# Patient Record
Sex: Male | Born: 1985 | Hispanic: Yes | Marital: Single | Smoking: Never smoker
Health system: Southern US, Community
[De-identification: ages and names within clinical notes are randomized; demographics above are authoritative.]

---

## 2005-11-12 ENCOUNTER — Ambulatory Visit: Payer: Self-pay | Admitting: Family Medicine

## 2016-03-08 ENCOUNTER — Emergency Department (HOSPITAL_COMMUNITY): Payer: Self-pay

## 2016-03-08 ENCOUNTER — Encounter (HOSPITAL_COMMUNITY): Payer: Self-pay | Admitting: Emergency Medicine

## 2016-03-08 ENCOUNTER — Emergency Department (HOSPITAL_COMMUNITY)
Admission: EM | Admit: 2016-03-08 | Discharge: 2016-03-08 | Disposition: A | Discharge status: Home / Self Care | Payer: Self-pay | Attending: Emergency Medicine | Admitting: Emergency Medicine

## 2016-03-08 DIAGNOSIS — K6289 Other specified diseases of anus and rectum: Secondary | ICD-10-CM | POA: Insufficient documentation

## 2016-03-08 LAB — URINALYSIS, ROUTINE W REFLEX MICROSCOPIC
Bilirubin Urine: NEGATIVE
Glucose, UA: NEGATIVE mg/dL
HGB URINE DIPSTICK: NEGATIVE
Ketones, ur: NEGATIVE mg/dL
Leukocytes, UA: NEGATIVE
Nitrite: NEGATIVE
PROTEIN: NEGATIVE mg/dL
Specific Gravity, Urine: 1.024 (ref 1.005–1.030)
pH: 5.5 (ref 5.0–8.0)

## 2016-03-08 LAB — I-STAT CHEM 8, ED
BUN: 15 mg/dL (ref 6–20)
CHLORIDE: 102 mmol/L (ref 101–111)
Calcium, Ion: 1.18 mmol/L (ref 1.15–1.40)
Creatinine, Ser: 0.9 mg/dL (ref 0.61–1.24)
Glucose, Bld: 100 mg/dL — ABNORMAL HIGH (ref 65–99)
HCT: 47 % (ref 39.0–52.0)
HEMOGLOBIN: 16 g/dL (ref 13.0–17.0)
Potassium: 3.7 mmol/L (ref 3.5–5.1)
SODIUM: 140 mmol/L (ref 135–145)
TCO2: 25 mmol/L (ref 0–100)

## 2016-03-08 MED ORDER — IOPAMIDOL (ISOVUE-300) INJECTION 61%
INTRAVENOUS | Status: AC
  Administered 2016-03-08: 100 mL
  Filled 2016-03-08: qty 100

## 2016-03-08 MED ORDER — METRONIDAZOLE 500 MG PO TABS: 500.0000 mg | ORAL_TABLET | Freq: Two times a day (BID) | ORAL | 0 refills | Status: AC

## 2016-03-08 MED ORDER — FENTANYL CITRATE (PF) 100 MCG/2ML IJ SOLN
50.0000 ug | Freq: Once | INTRAMUSCULAR | Status: AC
  Administered 2016-03-08: 50 ug via INTRAVENOUS
  Filled 2016-03-08: qty 2

## 2016-03-08 MED ORDER — CIPROFLOXACIN HCL 500 MG PO TABS: 500.0000 mg | ORAL_TABLET | Freq: Two times a day (BID) | ORAL | 0 refills | Status: AC

## 2016-03-08 NOTE — ED Notes (Signed)
Pt comfortable with discharge and follow up instructions. Pt declines wheelchair, escorted to waiting area by this RN. Rx x2 

## 2016-03-08 NOTE — Discharge Instructions (Signed)
Take the antibiotics as prescribed. Follow up with the GI doctor. Return to the ED if you develop new or worsening symptoms.

## 2016-03-08 NOTE — ED Triage Notes (Signed)
Pt reports three months of hemorrhoid with no relief from the medications he has tried. Pt here for pain relief from the hemorrhoid. Pt also thinks he has a UTI and reports frequent painful urination.

## 2016-03-08 NOTE — ED Provider Notes (Signed)
[facility] DEPT Provider Note   CSN: [id number] Arrival date & time: 03/08/16  1558  By signing my name below, I, Sandrea HammondStephen Dignan, attest that this documentation has been prepared under the direction and in the presence of Glynn OctaveStephen Reegan Mctighe, MD. Electronically Signed: Sandrea HammondStephen Dignan, ED Scribe. 03/08/16. 4:27 PM.   History   Chief Complaint Chief Complaint  Patient presents with  . Hemorrhoids  . Urinary Tract Infection    HPI Comments: Joel Nolan is a 30 y.o. male who presents to the Emergency Department complaining of painful bowel movements after being diagnosed with  hemorrhoids one month ago despite the use of his prescribed cream. Pt says he sees bright red blood when he wipes his anus after a bowel movement with intermittent bright red blood in stool. Pt says he sometimes strains during BM. He denies hard stools, black stools, or unusual change in stool character. Pt says he has back pain and pain on each BM. He denies stomach pain.    The history is provided by the patient. No language interpreter was used.    History reviewed. No pertinent past medical history.  There are no active problems to display for this patient.   No past surgical history on file.     Home Medications    Prior to Admission medications   Not on File    Family History No family history on file.  Social History Social History  Substance Use Topics  . Smoking status: Not on file  . Smokeless tobacco: Not on file  . Alcohol use Not on file     Allergies   Review of patient's allergies indicates no known allergies.   Review of Systems Review of Systems  Constitutional: Negative for fever.  Gastrointestinal: Positive for blood in stool and rectal pain.  Musculoskeletal: Positive for back pain.  All other systems reviewed and are negative.    Physical Exam Updated Vital Signs BP 133/83 (BP Location: Right Arm)   Pulse 78   Temp 98.1 F (36.7 C) (Oral)   SpO2 99%     Physical Exam  Constitutional: He is oriented to person, place, and time. He appears well-developed and well-nourished. No distress.  HENT:  Head: Normocephalic and atraumatic.  Mouth/Throat: Oropharynx is clear and moist. No oropharyngeal exudate.  Eyes: Conjunctivae and EOM are normal. Pupils are equal, round, and reactive to light.  Neck: Normal range of motion. Neck supple.  No meningismus.  Cardiovascular: Normal rate, regular rhythm, normal heart sounds and intact distal pulses.   No murmur heard. Pulmonary/Chest: Effort normal and breath sounds normal. No respiratory distress.  Abdominal: Soft. There is no tenderness. There is no rebound and no guarding.  Abdomen soft, nontender  Genitourinary:  Genitourinary Comments: Small abrasion at the 1 o'clock position around his anus No external hemorrhoids No fissures Induration on digital rectal exam from the 12 o'clock position to the 6 o'clock position  Musculoskeletal: Normal range of motion. He exhibits no edema or tenderness.  Neurological: He is alert and oriented to person, place, and time. No cranial nerve deficit. He exhibits normal muscle tone. Coordination normal.  No ataxia on finger to nose bilaterally. No pronator drift. 5/5 strength throughout. CN 2-12 intact.Equal grip strength. Sensation intact.   Skin: Skin is warm.  Psychiatric: He has a normal mood and affect. His behavior is normal.  Nursing note and vitals reviewed.    ED Treatments / Results   DIAGNOSTIC STUDIES: Oxygen Saturation is 99% on RA, normal by  my interpretation.    COORDINATION OF CARE: 4:20 PM Discussed treatment plan with pt at bedside and pt agreed to plan.   Labs (all labs ordered are listed, but only abnormal results are displayed) Labs Reviewed  I-STAT CHEM 8, ED - Abnormal; Notable for the following:       Result Value   Glucose, Bld 100 (*)    All other components within normal limits  URINALYSIS, ROUTINE W REFLEX MICROSCOPIC  (NOT AT [facility])    EKG  EKG Interpretation None       Radiology Ct Pelvis W Contrast  Result Date: 03/08/2016 CLINICAL DATA:  Rectal induration on the right. Pain in that region for the past 3 weeks. Hemorrhoids. Clinical concern for abscess. EXAM: CT PELVIS WITH CONTRAST TECHNIQUE: Multidetector CT imaging of the pelvis was performed using the standard protocol following the bolus administration of intravenous contrast. CONTRAST:  ISOVUE-300 IOPAMIDOL (ISOVUE-300) INJECTION 61% COMPARISON:  None. FINDINGS: Urinary Tract:  No abnormality visualized. Bowel: Mild sigmoid and descending colon diverticulosis without evidence of diverticulitis. Mild inferior rectal wall thickening. Normal appearing appendix and small bowel. Vascular/Lymphatic: No pathologically enlarged lymph nodes. No significant vascular abnormality seen. Reproductive:  Normal sized prostate gland. Other: Small left inguinal hernia containing fat. Mildly prominent vessel extending posteriorly from the rectum on the right, within the subcutaneous fat. No soft tissue stranding or fluid collection. Musculoskeletal: Normal appearing bones. IMPRESSION: 1. Probable small varicosity in the subcutaneous fat extending posteriorly from the rectum on the right. 2. Mild inferior rectal wall thickening. This could be due to proctitis. A neoplastic process is less likely. 3. No abscess seen. 4. Sigmoid and distal descending colon diverticulosis. Electronically Signed   By: Beckie Salts M.D.   On: 03/08/2016 17:20    Procedures Procedures (including critical care time)  Medications Ordered in ED Medications - No data to display   Initial Impression / Assessment and Plan / ED Course  I have reviewed the triage vital signs and the nursing notes.  Pertinent labs & imaging results that were available during my care of the patient were reviewed by me and considered in my medical decision making (see chart for details).  Clinical Course  1  month of rectal pain, bleeding with BM, told had hemorrhoids but none seen on exam.   Induration on exam with concern for possible perirectal abscess.  CT scan obtained which shows no abscess but does show proctitis and inflamed varicosity.  Patient with no history of inflammatory bowel disease. Start Flagyl and cipro. Refer to gastroenterology. Return precautions discussed.   Final Clinical Impressions(s) / ED Diagnoses   Final diagnoses:  Proctitis    New Prescriptions New Prescriptions   No medications on file   I personally performed the services described in this documentation, which was scribed in my presence. The recorded information has been reviewed and is accurate.      Glynn Octave, MD 03/08/16 1754

## 2016-03-30 ENCOUNTER — Encounter (HOSPITAL_COMMUNITY): Payer: Self-pay | Admitting: Emergency Medicine

## 2016-03-30 ENCOUNTER — Emergency Department (HOSPITAL_COMMUNITY)
Admission: EM | Admit: 2016-03-30 | Discharge: 2016-03-30 | Disposition: A | Discharge status: Home / Self Care | Payer: Self-pay | Attending: Emergency Medicine | Admitting: Emergency Medicine

## 2016-03-30 DIAGNOSIS — K6289 Other specified diseases of anus and rectum: Secondary | ICD-10-CM

## 2016-03-30 LAB — GC/CHLAMYDIA PROBE AMP (~~LOC~~) NOT AT ARMC
Chlamydia: NEGATIVE
Neisseria Gonorrhea: NEGATIVE

## 2016-03-30 MED ORDER — SULFAMETHOXAZOLE-TRIMETHOPRIM 800-160 MG PO TABS: 1.0000 | ORAL_TABLET | Freq: Two times a day (BID) | ORAL | 0 refills | Status: AC

## 2016-03-30 MED ORDER — TRAMADOL HCL 50 MG PO TABS: 50.0000 mg | ORAL_TABLET | Freq: Four times a day (QID) | ORAL | 0 refills | Status: AC | PRN

## 2016-03-30 MED ORDER — HYDROCODONE-ACETAMINOPHEN 5-325 MG PO TABS
1.0000 | ORAL_TABLET | Freq: Once | ORAL | Status: AC
  Administered 2016-03-30: 1 via ORAL
  Filled 2016-03-30 (×2): qty 1

## 2016-03-30 MED ORDER — LIDOCAINE (ANORECTAL) 5 % EX GEL: 1.0000 "application " | CUTANEOUS | 2 refills | Status: AC | PRN

## 2016-03-30 NOTE — ED Triage Notes (Signed)
Patient states he was seen here for Hemorid states its been going on for 2 months. Patient states he was given referral for GI but appt is 2 months away and can not tolerate pain. Patient states pain when he has BM.

## 2016-03-30 NOTE — ED Provider Notes (Signed)
[facility] Provider Note   CSN: [id number] Arrival date & time: 03/30/16  0800     History   Chief Complaint No chief complaint on file.   HPI Joel Nolan is a 30 y.o. male with a past medical history of hemorrhoids, proctitis who presents to the ED today complaining of ongoing rectal pain. Patient states that he was [place] He was given cream and stool softeners which did not provide him any relief. He was [facility] 2 weeks ago and had a CT scan of his pelvis performed. He was told that time that he no longer had hemorrhoids. He was told to follow-up with GI but states his appointment is not for another 3 months he has not been able to see them. He currently reports continued severe rectal pain and swelling. Patient also has pain with bowel movements despite stool softener use. He sees occasional bright red blood on the toilet paper with wiping. He denies having anal sex. He denies abdominal pain, melena, diarrhea, vomiting, dysuria.  HPI  History reviewed. No pertinent past medical history.  There are no active problems to display for this patient.   History reviewed. No pertinent surgical history.     Home Medications    Prior to Admission medications   Medication Sig Start Date End Date Taking? Authorizing Provider  ciprofloxacin (CIPRO) 500 MG tablet Take 1 tablet (500 mg total) by mouth 2 (two) times daily. 03/08/16   Glynn OctaveStephen Rancour, MD  Lidocaine, Anorectal, 5 % GEL Apply 1 application topically as needed. 03/30/16   Dartanyan Deasis Tripp Zakir Henner, PA-C  metroNIDAZOLE (FLAGYL) 500 MG tablet Take 1 tablet (500 mg total) by mouth 2 (two) times daily. 03/08/16   Glynn OctaveStephen Rancour, MD  sulfamethoxazole-trimethoprim (BACTRIM DS,SEPTRA DS) 800-160 MG tablet Take 1 tablet by mouth 2 (two) times daily. 03/30/16 04/06/16  Julizza Sassone Tripp Ishitha Roper, PA-C  traMADol (ULTRAM) 50 MG tablet Take 1 tablet (50  mg total) by mouth every 6 (six) hours as needed. 03/30/16   Angelino Rumery Tripp Kitt Ledet, PA-C    Family History No family history on file.  Social History Social History  Substance Use Topics  . Smoking status: Never Smoker  . Smokeless tobacco: Never Used  . Alcohol use Yes     Allergies   Review of patient's allergies indicates no known allergies.   Review of Systems Review of Systems  All other systems reviewed and are negative.    Physical Exam Updated Vital Signs BP 112/94   Pulse 74   Temp 98.1 F (36.7 C) (Oral)   Resp 15   SpO2 97%   Physical Exam  Constitutional: He is oriented to person, place, and time. He appears well-developed and well-nourished. No distress.  HENT:  Head: Normocephalic and atraumatic.  Eyes: Conjunctivae are normal. Right eye exhibits no discharge. Left eye exhibits no discharge. No scleral icterus.  Cardiovascular: Normal rate.   Pulmonary/Chest: Effort normal.  Genitourinary:  Genitourinary Comments: No external hemorrhoid present. There is induration around rectum but no obvious fluctuance or abscess. TTP on DRE. No gross blood on rectal exam.  Neurological: He is alert and oriented to person, place, and time. Coordination normal.  Skin: Skin is warm and dry. No rash noted. He is not diaphoretic. No erythema. No pallor.  Psychiatric: He has a normal mood and affect. His behavior is normal.  Nursing note and vitals reviewed.    ED  Treatments / Results  Labs (all labs ordered are listed, but only abnormal results are displayed) Labs Reviewed  GC/CHLAMYDIA PROBE AMP ([facility]wa Gentry) NOT AT [facility]    EKG  EKG Interpretation None       Radiology No results found.  Procedures Procedures (including critical care time)  Medications Ordered in ED Medications  HYDROcodone-acetaminophen (NORCO/VICODIN) 5-325 MG per tablet 1 tablet (1 tablet Oral Given 03/30/16 7846)     Initial Impression / Assessment and Plan / ED Course  I  have reviewed the triage vital signs and the nursing notes.  Pertinent labs & imaging results that were available during my care of the patient were reviewed by me and considered in my medical decision making (see chart for details).  Clinical Course    Otherwise healthy 30 year old male presents the ED today complaining of ongoing rectal pain. Patient also reports pain with bowel movements and occasional bright red blood on the toilet paper with wiping. On presentation ED, patient overall appears well. Vitals are stable. Reviewed CT scan From 10/5 which showed signs of proctitis varicosity. Patient was initiated on Cipro and Flagyl which she states he took without relief of his symptoms. Will swab rectum today for GC/chlamydia as this is common causes of infectious proctitis. Patient still denies having anal sex. On exam, patient does have some mild induration around the rectum but no obvious sign of abscess and recent CT scan did not show any sign of abscess. Will not repeat today. However, will initiate additional antibiotic to cover for skin infection. We'll also provide pain medication, lidocaine jelly. GI referral again given. Encourage patient to continue stool softener use.  Patient was discussed with and seen by Dr. Adriana Simas who agrees with the treatment plan.    Final Clinical Impressions(s) / ED Diagnoses   Final diagnoses:  Proctitis    New Prescriptions New Prescriptions   LIDOCAINE, ANORECTAL, 5 % GEL    Apply 1 application topically as needed.   SULFAMETHOXAZOLE-TRIMETHOPRIM (BACTRIM DS,SEPTRA DS) 800-160 MG TABLET    Take 1 tablet by mouth 2 (two) times daily.   TRAMADOL (ULTRAM) 50 MG TABLET    Take 1 tablet (50 mg total) by mouth every 6 (six) hours as needed.     Lester Kinsman [place], PA-C 03/31/16 1607    Donnetta Hutching, MD 04/01/16 [phone]

## 2016-03-30 NOTE — Discharge Instructions (Signed)
Take antibiotics as prescribed. Use anusol suppositories. Continue taking stool softener. Use lidocaine jelly for pain. Follow up with GI as soon as possible for re-evaluation.

## 2016-04-03 ENCOUNTER — Encounter (HOSPITAL_COMMUNITY): Payer: Self-pay | Admitting: Emergency Medicine

## 2016-04-03 ENCOUNTER — Emergency Department (HOSPITAL_COMMUNITY)
Admission: EM | Admit: 2016-04-03 | Discharge: 2016-04-03 | Disposition: A | Discharge status: Home / Self Care | Payer: Self-pay | Attending: Emergency Medicine | Admitting: Emergency Medicine

## 2016-04-03 DIAGNOSIS — K6289 Other specified diseases of anus and rectum: Secondary | ICD-10-CM | POA: Insufficient documentation

## 2016-04-03 MED ORDER — DOCUSATE SODIUM 100 MG PO CAPS: 100.0000 mg | ORAL_CAPSULE | Freq: Two times a day (BID) | ORAL | 0 refills | Status: AC

## 2016-04-03 MED ORDER — OXYCODONE-ACETAMINOPHEN 5-325 MG PO TABS: 1.0000 | ORAL_TABLET | ORAL | 0 refills | Status: AC | PRN

## 2016-04-03 MED ORDER — DOCUSATE SODIUM 100 MG PO CAPS: 100.0000 mg | ORAL_CAPSULE | Freq: Two times a day (BID) | ORAL | 0 refills | Status: DC

## 2016-04-03 NOTE — Discharge Instructions (Signed)
Take your prescriptions stool softener as prescribed to help prevent hard stool/constipation to improve your rectal pain. I recommend continuing to take your antibiotic prescription of Bactrim as prescribed until completed. I also recommend doing warm sitz baths daily. Follow-up with gastroenterology at your scheduled appointment for further management and evaluation of your rectal pain. Return to the emergency department if symptoms worsen or new onset of fever, abdominal pain, vomiting, constipation, rectal bleeding, drainage, worsening swelling/pain, swelling/redness/pain to scrotum/testicles.

## 2016-04-03 NOTE — ED Provider Notes (Signed)
[facility] Provider Note   CSN: [id number] Arrival date & time: 04/03/16  1146  By signing my name below, I, Sonum Patel, attest that this documentation has been prepared under the direction and in the presence of Melburn Hake, PA-C. Electronically Signed: Sonum Patel, Neurosurgeon. 04/03/16. 2:08 PM.  History   Chief Complaint Chief Complaint  Patient presents with  . Hemorrhoids    The history is provided by the patient. No language interpreter was used.     HPI Comments: Joel Nolan is a 30 y.o. male who presents to the Emergency Department complaining of persistent, unchanged rectal pain from a hemorrhoid diagnosed 1 month ago. He states the pain has continued and has not improved and is worse with having BM's. He has also tried OTC stool softeners without relief. He states his last BM was today and states he notices bright red blood upon wiping.Marland Kitchen He denies fever, abdominal pain, hematuria, pain/swelling/redness to scrotum or testes, constipation, hematochezia.  Per chart review he was initially seen for this in the ED on 03/08/16. He had CT scan which showed no abscess but did show proctitis and inflamed varicosity and was d/c home of cipro and flagyl. He was again seen on 04/01/16 and was prescribed Bactrim, tramadol, and anorectal lidocaine gel which he said has not helped. He reports having an appointment scheduled with GI in 2 months.  History reviewed. No pertinent past medical history.  There are no active problems to display for this patient.   History reviewed. No pertinent surgical history.     Home Medications    Prior to Admission medications   Medication Sig Start Date End Date Taking? Authorizing Provider  ciprofloxacin (CIPRO) 500 MG tablet Take 1 tablet (500 mg total) by mouth 2 (two) times daily. 03/08/16   Glynn Octave, MD  Lidocaine, Anorectal, 5 % GEL Apply 1 application topically as needed. 03/30/16   Samantha Tripp [place], PA-C    metroNIDAZOLE (FLAGYL) 500 MG tablet Take 1 tablet (500 mg total) by mouth 2 (two) times daily. 03/08/16   Glynn Octave, MD  sulfamethoxazole-trimethoprim (BACTRIM DS,SEPTRA DS) 800-160 MG tablet Take 1 tablet by mouth 2 (two) times daily. 03/30/16 04/06/16  Samantha Tripp [place], PA-C  traMADol (ULTRAM) 50 MG tablet Take 1 tablet (50 mg total) by mouth every 6 (six) hours as needed. 03/30/16   Samantha Tripp Dowless, PA-C    Family History No family history on file.  Social History Social History  Substance Use Topics  . Smoking status: Never Smoker  . Smokeless tobacco: Never Used  . Alcohol use Yes     Allergies   Review of patient's allergies indicates no known allergies.   Review of Systems Review of Systems  Constitutional: Negative for fever.  Gastrointestinal: Positive for anal bleeding (slight with wiping), rectal pain and vomiting. Negative for abdominal pain and blood in stool.       -hematemesis   Genitourinary: Negative for hematuria.     Physical Exam Updated Vital Signs BP 132/90 (BP Location: Left Arm)   Pulse 78   Temp 98 F (36.7 C) (Oral)   Resp 17   Ht 5\' 2"  (1.575 m)   Wt 210 lb (95.3 kg)   SpO2 99%   BMI 38.41 kg/m   Physical Exam  Constitutional: He is oriented to person, place, and time. He appears well-developed and well-nourished.  HENT:  Head: Normocephalic and atraumatic.  Mouth/Throat: Oropharynx is clear and moist. No oropharyngeal exudate.  Eyes: Conjunctivae and  EOM are normal. Right eye exhibits no discharge. Left eye exhibits no discharge. No scleral icterus.  Neck: Normal range of motion. Neck supple.  Cardiovascular: Normal rate, regular rhythm, normal heart sounds and intact distal pulses.   Pulmonary/Chest: Effort normal and breath sounds normal. No respiratory distress. He has no wheezes. He has no rales. He exhibits no tenderness.  Abdominal: Soft. Bowel sounds are normal. He exhibits no distension and no mass. There is  no tenderness. There is no rebound and no guarding.  Genitourinary: Prostate normal, testes normal and penis normal. Rectal exam shows tenderness. Rectal exam shows no external hemorrhoid, no internal hemorrhoid, no fissure and anal tone normal.  Genitourinary Comments: No external hemorrhoids. Small area of swelling and induration noted to left side of rectum visually and palpated on DRE, tender to palpation. No gross blood. No fluctuance or drainage.   Musculoskeletal: Normal range of motion. He exhibits no edema.  Neurological: He is alert and oriented to person, place, and time.  Skin: Skin is warm and dry. He is not diaphoretic.  Nursing note and vitals reviewed.    ED Treatments / Results  DIAGNOSTIC STUDIES: Oxygen Saturation is 99% on RA, normal by my interpretation.    COORDINATION OF CARE: 1:52 PM Discussed treatment plan with pt at bedside and pt agreed to plan.    Labs (all labs ordered are listed, but only abnormal results are displayed) Labs Reviewed - No data to display  EKG  EKG Interpretation None       Radiology No results found.  Procedures Procedures (including critical care time)  Medications Ordered in ED Medications - No data to display   Initial Impression / Assessment and Plan / ED Course  I have reviewed the triage vital signs and the nursing notes.  Pertinent labs & imaging results that were available during my care of the patient were reviewed by me and considered in my medical decision making (see chart for details).  Clinical Course    Patient presents with continued rectal pain which has been present and unchanged for the past month. He was initially seen in the ED on 10/5 for rectal pain, CT abdomen with pelvis revealed probable small varicosity in subcutaneous fat, mild inferior rectal wall thickening concerning for proctitis, no abscess seen. Patient was discharged home on Cipro and Flagyl and referred to GI. He returned to the ED on  10/27 for continued rectal pain, reports completing his initial antibiotics, negative GC/chlamydia rectal swab, continued induration noted around rectum with no obvious sign of abscess, patient started on Bactrim and given lidocaine jelly. Patient reports no improvement of symptoms but reports having worsening pain with use of lidocaine jelly. Denies fever or abdominal pain. VSS. Exam revealed induration noted around rectum with no fluctuance or drainage, no gross blood on rectal exam, no visible hemorrhoids. Remaining exam unremarkable. Due to patient with continued, unchanged symptoms since having his CT performed a few weeks ago I do not feel that repeat imaging is warranted at this time. No evidence of abscess on exam. Plan to discharge patient home with pain meds and stool softeners, advised to refrain from using lidocaine jelly as it is worsening his pain. Instructions given regarding sitz bath. Advised patient to follow up with GI at his scheduled appointment. Discussed strict return precautions.  Final Clinical Impressions(s) / ED Diagnoses   Final diagnoses:  None    New Prescriptions New Prescriptions   No medications on file   I personally performed the  services described in this documentation, which was scribed in my presence. The recorded information has been reviewed and is accurate.    Satira Sarkicole [place], [place]PA-C 04/03/16 1446    Marily MemosJason Mesner, MD 04/05/16 [phone]

## 2016-04-03 NOTE — ED Triage Notes (Signed)
Pt states he was told he had hemorrhoids two weeks ago and today it hurts to use the bathroom and "when I wipe my butt it hurts and theres a little bit of blood". Denies N/V/D.

## 2018-03-04 IMAGING — CT CT PELVIS W/ CM
2 of 3 series · 17 of 46 positions shown, 19 images · IV contrast (iopamidol)
Comparison: None.

CLINICAL DATA: Rectal induration on the right. Pain in that region
for the past 3 weeks. Hemorrhoids. Clinical concern for abscess.

EXAM:
CT PELVIS WITH CONTRAST
TECHNIQUE: Multidetector CT imaging of the pelvis was performed using the
standard protocol following the bolus administration of intravenous
contrast.
CONTRAST:  100mL [id number] IOPAMIDOL (MN397Q-CCC) INJECTION 61%

[Series 2: pelvis with 5.0 · axial · 0.87mm/px · z∈[-496,-196]mm · 14 of 70 slices shown, 16 images]
[im 5/70  soft-tissue]
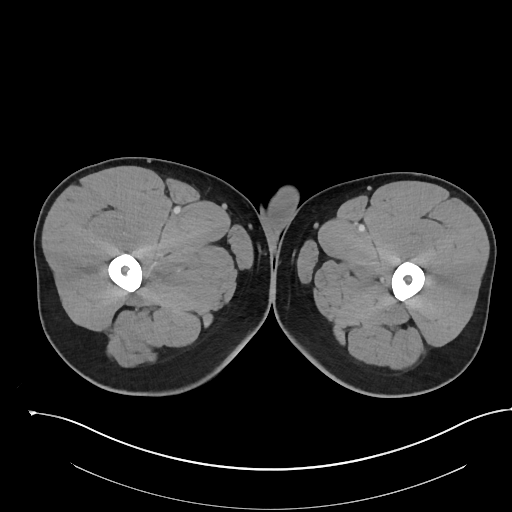
[im 5/70  bone]
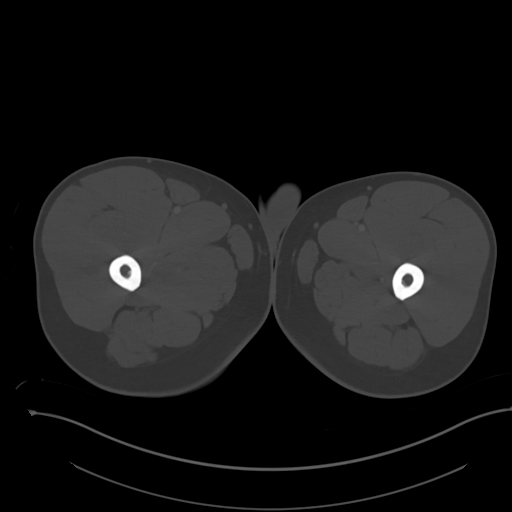
[im 9/70  soft-tissue]
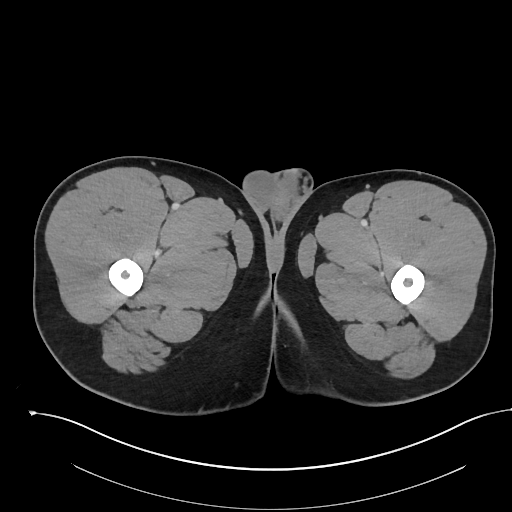
[im 14/70  soft-tissue]
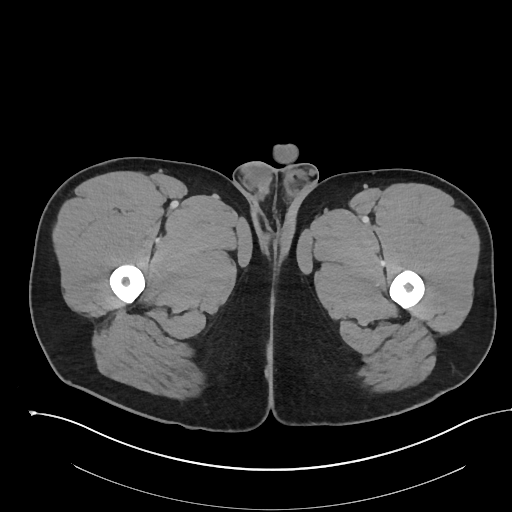
[im 18/70  soft-tissue]
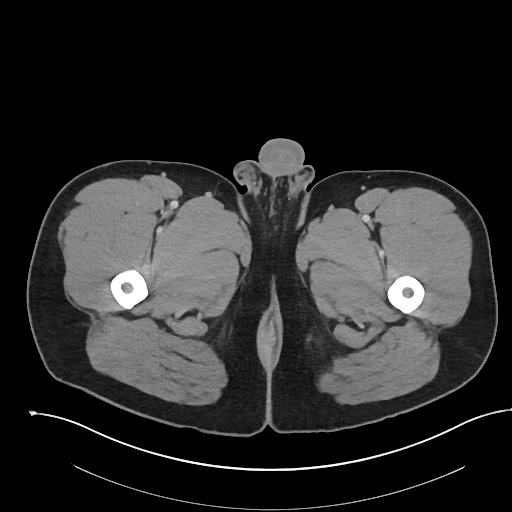
[im 23/70  soft-tissue]
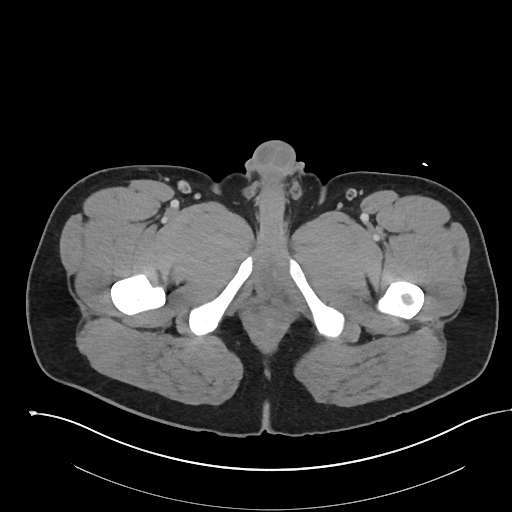
[im 27/70  soft-tissue]
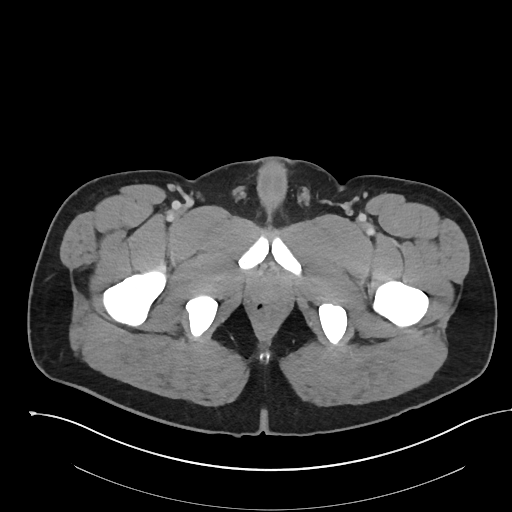
[im 32/70  soft-tissue]
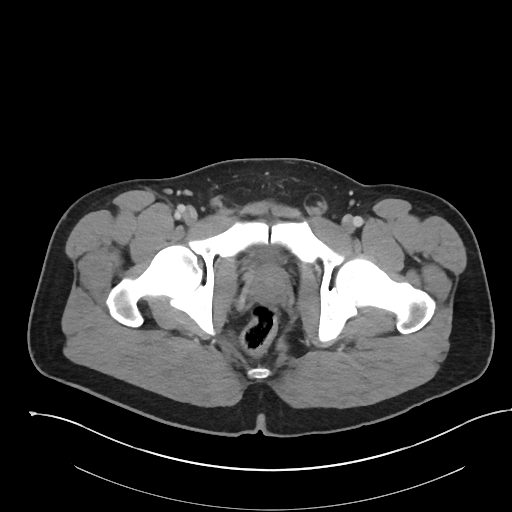
[im 38/70  soft-tissue]
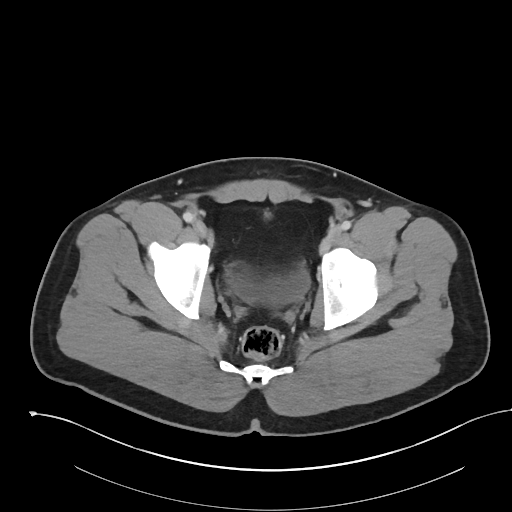
[im 43/70  soft-tissue]
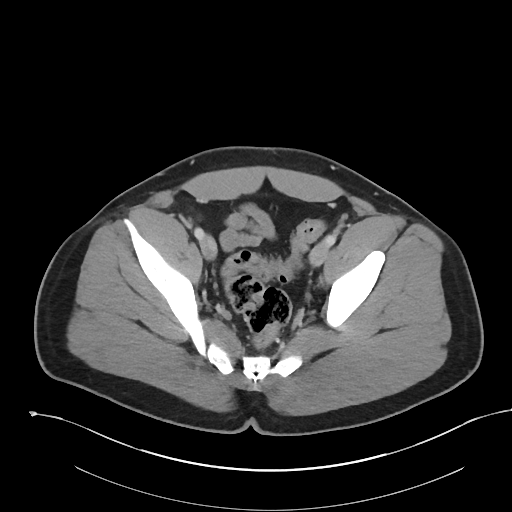
[im 43/70  bone]
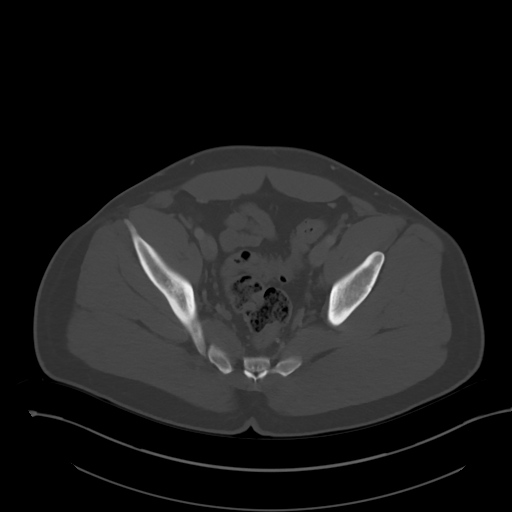
[im 47/70  soft-tissue]
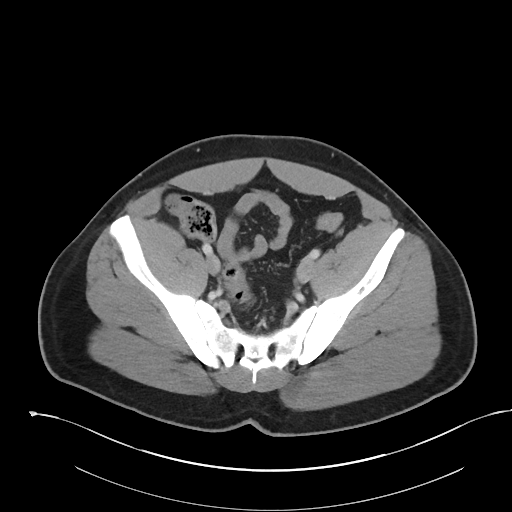
[im 52/70  soft-tissue]
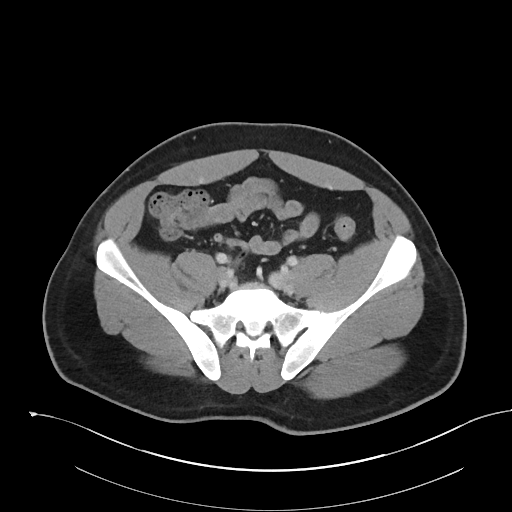
[im 56/70  soft-tissue]
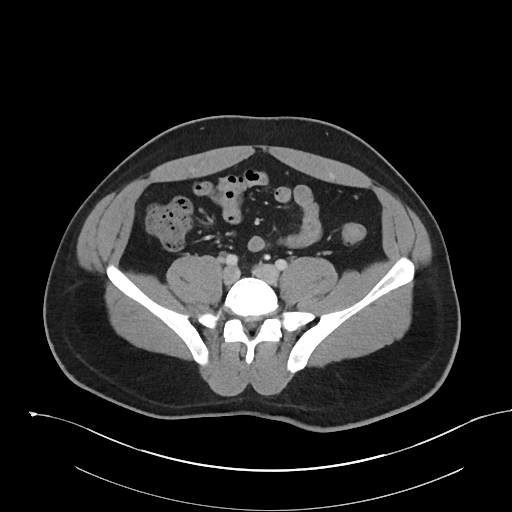
[im 61/70  soft-tissue]
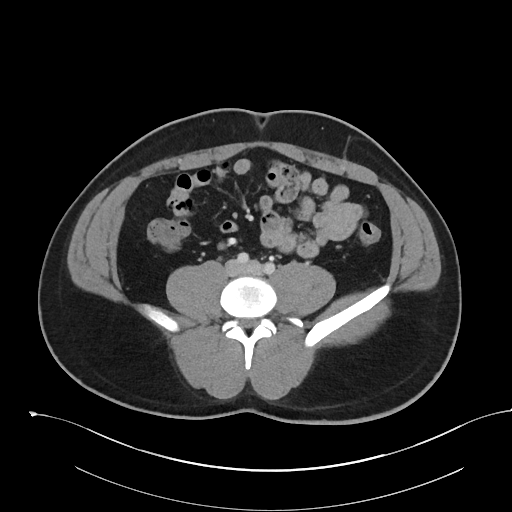
[im 65/70  soft-tissue]
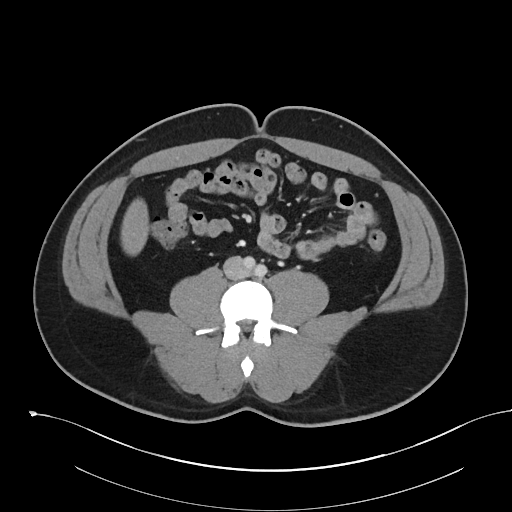

[Series 4: pelvis with 2.0 cor · coronal · 0.73mm/px · 3 of 130 slices shown]
[im 44/130  soft-tissue]
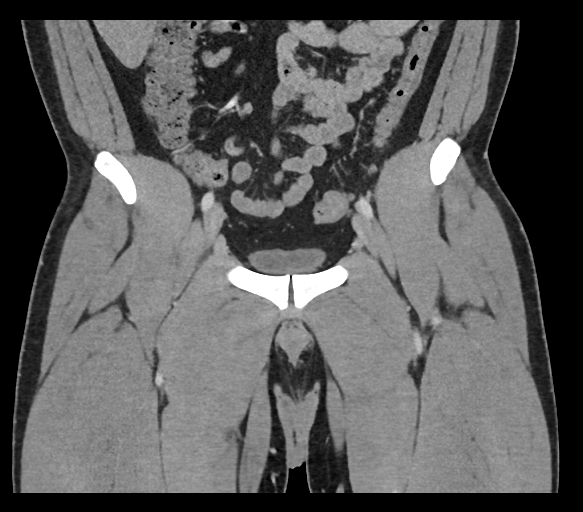
[im 58/130  soft-tissue]
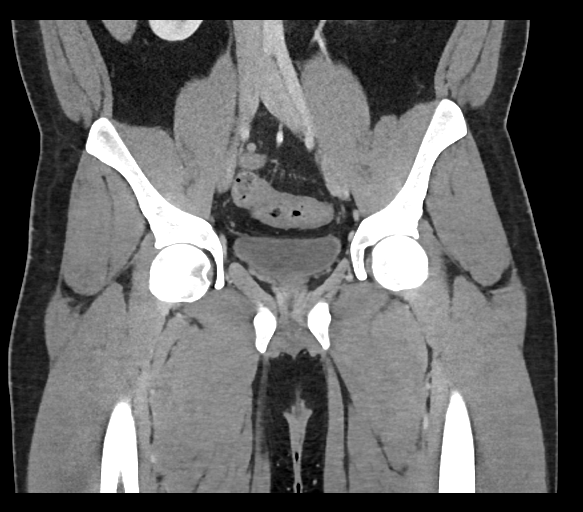
[im 72/130  soft-tissue]
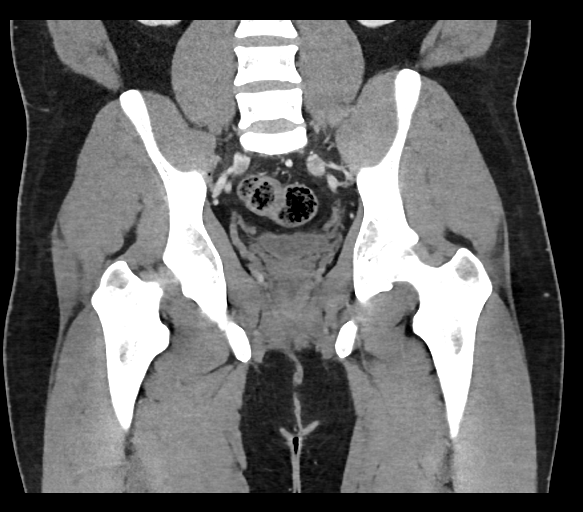

[17 of 46 positions shown; findings below may reference images not displayed]

FINDINGS: Urinary Tract:  No abnormality visualized.

Bowel: Mild sigmoid and descending colon diverticulosis without
evidence of diverticulitis. Mild inferior rectal wall thickening.
Normal appearing appendix and small bowel.

Vascular/Lymphatic: No pathologically enlarged lymph nodes. No
significant vascular abnormality seen.

Reproductive:  Normal sized prostate gland.

Other: Small left inguinal hernia containing fat. Mildly prominent
vessel extending posteriorly from the rectum on the right, within
the subcutaneous fat. No soft tissue stranding or fluid collection.

Musculoskeletal: Normal appearing bones.
IMPRESSION: 1. Probable small varicosity in the subcutaneous fat extending
posteriorly from the rectum on the right.
2. Mild inferior rectal wall thickening. This could be due to
proctitis. A neoplastic process is less likely.
3. No abscess seen.
4. Sigmoid and distal descending colon diverticulosis.
